# Patient Record
Sex: Female | Born: 1995 | Hispanic: Yes | Marital: Single
Health system: Southern US, Community
[De-identification: ages and names within clinical notes are randomized; demographics above are authoritative.]

---

## 2020-10-02 ENCOUNTER — Other Ambulatory Visit: Payer: Self-pay

## 2020-10-02 ENCOUNTER — Emergency Department (HOSPITAL_COMMUNITY): Payer: Medicaid Other

## 2020-10-02 ENCOUNTER — Emergency Department (HOSPITAL_COMMUNITY)
Admission: EM | Admit: 2020-10-02 | Discharge: 2020-10-02 | Disposition: A | Discharge status: Home / Self Care | Payer: Medicaid Other | Attending: Emergency Medicine | Admitting: Emergency Medicine

## 2020-10-02 ENCOUNTER — Encounter (HOSPITAL_COMMUNITY): Payer: Self-pay | Admitting: Emergency Medicine

## 2020-10-02 DIAGNOSIS — U071 COVID-19: Secondary | ICD-10-CM | POA: Diagnosis not present

## 2020-10-02 DIAGNOSIS — S42351A Displaced comminuted fracture of shaft of humerus, right arm, initial encounter for closed fracture: Secondary | ICD-10-CM | POA: Insufficient documentation

## 2020-10-02 DIAGNOSIS — S4991XA Unspecified injury of right shoulder and upper arm, initial encounter: Secondary | ICD-10-CM | POA: Diagnosis present

## 2020-10-02 DIAGNOSIS — Y92410 Unspecified street and highway as the place of occurrence of the external cause: Secondary | ICD-10-CM | POA: Diagnosis not present

## 2020-10-02 DIAGNOSIS — Y9389 Activity, other specified: Secondary | ICD-10-CM | POA: Insufficient documentation

## 2020-10-02 LAB — CBC WITH DIFFERENTIAL/PLATELET
Abs Immature Granulocytes: 0.06 10*3/uL (ref 0.00–0.07)
Basophils Absolute: 0 10*3/uL (ref 0.0–0.1)
Basophils Relative: 0 %
Eosinophils Absolute: 0.1 10*3/uL (ref 0.0–0.5)
Eosinophils Relative: 1 %
HCT: 39 % (ref 36.0–46.0)
Hemoglobin: 12.7 g/dL (ref 12.0–15.0)
Immature Granulocytes: 1 %
Lymphocytes Relative: 21 %
Lymphs Abs: 2 10*3/uL (ref 0.7–4.0)
MCH: 28.2 pg (ref 26.0–34.0)
MCHC: 32.6 g/dL (ref 30.0–36.0)
MCV: 86.5 fL (ref 80.0–100.0)
Monocytes Absolute: 0.6 10*3/uL (ref 0.1–1.0)
Monocytes Relative: 6 %
Neutro Abs: 6.8 10*3/uL (ref 1.7–7.7)
Neutrophils Relative %: 71 %
Platelets: 240 10*3/uL (ref 150–400)
RBC: 4.51 MIL/uL (ref 3.87–5.11)
RDW: 13.9 % (ref 11.5–15.5)
WBC: 9.6 10*3/uL (ref 4.0–10.5)
nRBC: 0 % (ref 0.0–0.2)

## 2020-10-02 LAB — COMPREHENSIVE METABOLIC PANEL
ALT: 36 U/L (ref 0–44)
AST: 20 U/L (ref 15–41)
Albumin: 3.5 g/dL (ref 3.5–5.0)
Alkaline Phosphatase: 62 U/L (ref 38–126)
Anion gap: 7 (ref 5–15)
BUN: 11 mg/dL (ref 6–20)
CO2: 25 mmol/L (ref 22–32)
Calcium: 8.9 mg/dL (ref 8.9–10.3)
Chloride: 107 mmol/L (ref 98–111)
Creatinine, Ser: 0.77 mg/dL (ref 0.44–1.00)
GFR, Estimated: >60 mL/min (ref >60)
Glucose, Bld: 104 mg/dL — ABNORMAL HIGH (ref 70–99)
Potassium: 4 mmol/L (ref 3.5–5.1)
Sodium: 139 mmol/L (ref 135–145)
Total Bilirubin: 0.3 mg/dL (ref 0.3–1.2)
Total Protein: 6.4 g/dL — ABNORMAL LOW (ref 6.5–8.1)

## 2020-10-02 LAB — RESP PANEL BY RT-PCR (FLU A&B, COVID) ARPGX2
Influenza A by PCR: NEGATIVE
Influenza B by PCR: NEGATIVE
SARS Coronavirus 2 by RT PCR: POSITIVE — AB

## 2020-10-02 LAB — I-STAT BETA HCG BLOOD, ED (MC, WL, AP ONLY): I-stat hCG, quantitative: <5 m[IU]/mL (ref <5)

## 2020-10-02 MED ORDER — HYDROMORPHONE HCL 1 MG/ML IJ SOLN
1.0000 mg | Freq: Once | INTRAMUSCULAR | Status: AC
  Administered 2020-10-02: 1 mg via INTRAVENOUS
  Filled 2020-10-02: qty 1

## 2020-10-02 MED ORDER — HYDROMORPHONE HCL 1 MG/ML IJ SOLN
1.0000 mg | Freq: Once | INTRAMUSCULAR | Status: AC
Start: 2020-10-02 — End: 2020-10-02
  Administered 2020-10-02: 1 mg via INTRAVENOUS
  Filled 2020-10-02: qty 1

## 2020-10-02 MED ORDER — OXYCODONE-ACETAMINOPHEN 5-325 MG PO TABS: 1.0000 | ORAL_TABLET | ORAL | 0 refills | Status: DC | PRN

## 2020-10-02 NOTE — ED Notes (Signed)
Paged ortho 
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
## 2020-10-02 NOTE — Progress Notes (Signed)
Orthopedic Tech Progress Note Patient Details:  Latoya Watson 06/27/1995 [id number]  Ortho Devices Type of Ortho Device: Coapt, Shoulder immobilizer Ortho Device/Splint Location: RUE Ortho Device/Splint Interventions: Ordered, Application, Adjustment   Post Interventions Patient Tolerated: Fair Instructions Provided: Care of device, Poper ambulation with device, Adjustment of device  [name] 10/02/2020, 9:18 PM
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
## 2020-10-02 NOTE — ED Notes (Signed)
Patient transported to X-ray 

## 2020-10-03 ENCOUNTER — Ambulatory Visit (HOSPITAL_COMMUNITY): Payer: Medicaid Other | Admitting: Critical Care Medicine

## 2020-10-03 ENCOUNTER — Encounter (HOSPITAL_COMMUNITY): Payer: Self-pay | Admitting: Orthopaedic Surgery

## 2020-10-03 ENCOUNTER — Encounter (HOSPITAL_BASED_OUTPATIENT_CLINIC_OR_DEPARTMENT_OTHER): Payer: Self-pay

## 2020-10-03 ENCOUNTER — Ambulatory Visit (HOSPITAL_COMMUNITY): Payer: Medicaid Other

## 2020-10-03 ENCOUNTER — Encounter (HOSPITAL_COMMUNITY)
Admission: RE | Disposition: A | Discharge status: Home / Self Care | Payer: Self-pay | Source: Ambulatory Visit | Attending: Orthopaedic Surgery

## 2020-10-03 ENCOUNTER — Ambulatory Visit (HOSPITAL_COMMUNITY)
Admission: RE | Admit: 2020-10-03 | Discharge: 2020-10-03 | Disposition: A | Discharge status: Home / Self Care | Payer: Medicaid Other | Source: Ambulatory Visit | Attending: Orthopaedic Surgery | Admitting: Orthopaedic Surgery

## 2020-10-03 DIAGNOSIS — S42351A Displaced comminuted fracture of shaft of humerus, right arm, initial encounter for closed fracture: Secondary | ICD-10-CM | POA: Insufficient documentation

## 2020-10-03 DIAGNOSIS — Z419 Encounter for procedure for purposes other than remedying health state, unspecified: Secondary | ICD-10-CM

## 2020-10-03 DIAGNOSIS — S42421A Displaced comminuted supracondylar fracture without intercondylar fracture of right humerus, initial encounter for closed fracture: Secondary | ICD-10-CM | POA: Diagnosis not present

## 2020-10-03 DIAGNOSIS — Z8616 Personal history of COVID-19: Secondary | ICD-10-CM | POA: Insufficient documentation

## 2020-10-03 DIAGNOSIS — Z09 Encounter for follow-up examination after completed treatment for conditions other than malignant neoplasm: Secondary | ICD-10-CM

## 2020-10-03 HISTORY — PX: HUMERUS IM NAIL: SHX1769

## 2020-10-03 LAB — CBC
HCT: 36.9 % (ref 36.0–46.0)
Hemoglobin: 12.2 g/dL (ref 12.0–15.0)
MCH: 28.6 pg (ref 26.0–34.0)
MCHC: 33.1 g/dL (ref 30.0–36.0)
MCV: 86.6 fL (ref 80.0–100.0)
Platelets: 236 10*3/uL (ref 150–400)
RBC: 4.26 MIL/uL (ref 3.87–5.11)
RDW: 14.1 % (ref 11.5–15.5)
WBC: 8.7 10*3/uL (ref 4.0–10.5)
nRBC: 0 % (ref 0.0–0.2)

## 2020-10-03 LAB — POCT PREGNANCY, URINE: Preg Test, Ur: NEGATIVE

## 2020-10-03 SURGERY — INSERTION, INTRAMEDULLARY ROD, HUMERUS
Anesthesia: Monitor Anesthesia Care | Site: Shoulder | Laterality: Right

## 2020-10-03 SURGERY — OPEN REDUCTION INTERNAL FIXATION (ORIF) HUMERAL SHAFT FRACTURE
Anesthesia: General | Laterality: Right

## 2020-10-03 MED ORDER — DIPHENHYDRAMINE HCL 50 MG/ML IJ SOLN
INTRAMUSCULAR | Status: DC | PRN
  Administered 2020-10-03: 6.25 mg via INTRAVENOUS

## 2020-10-03 MED ORDER — VANCOMYCIN HCL 1000 MG IV SOLR
INTRAVENOUS | Status: AC
  Filled 2020-10-03: qty 1000

## 2020-10-03 MED ORDER — GLYCOPYRROLATE 0.2 MG/ML IJ SOLN
INTRAMUSCULAR | Status: DC | PRN
  Administered 2020-10-03: .2 mg via INTRAVENOUS

## 2020-10-03 MED ORDER — CHLORHEXIDINE GLUCONATE 0.12 % MT SOLN
15.0000 mL | OROMUCOSAL | Status: AC
  Filled 2020-10-03: qty 15

## 2020-10-03 MED ORDER — ROCURONIUM BROMIDE 10 MG/ML (PF) SYRINGE
PREFILLED_SYRINGE | INTRAVENOUS | Status: DC | PRN
  Administered 2020-10-03: 100 mg via INTRAVENOUS

## 2020-10-03 MED ORDER — ARTIFICIAL TEARS OPHTHALMIC OINT
TOPICAL_OINTMENT | OPHTHALMIC | Status: DC | PRN
Start: 2020-10-03 — End: 2020-10-03
  Administered 2020-10-03: 1 via OPHTHALMIC

## 2020-10-03 MED ORDER — ACETAMINOPHEN 500 MG PO TABS: 1000.0000 mg | ORAL_TABLET | Freq: Three times a day (TID) | ORAL | 0 refills | Status: AC

## 2020-10-03 MED ORDER — FENTANYL CITRATE (PF) 100 MCG/2ML IJ SOLN
INTRAMUSCULAR | Status: AC
  Administered 2020-10-03: 100 ug via INTRAVENOUS
  Filled 2020-10-03: qty 2

## 2020-10-03 MED ORDER — PHENYLEPHRINE 40 MCG/ML (10ML) SYRINGE FOR IV PUSH (FOR BLOOD PRESSURE SUPPORT)
PREFILLED_SYRINGE | INTRAVENOUS | Status: DC | PRN
  Administered 2020-10-03 (×2): 80 ug via INTRAVENOUS

## 2020-10-03 MED ORDER — CHLORHEXIDINE GLUCONATE 0.12 % MT SOLN
OROMUCOSAL | Status: AC
  Administered 2020-10-03: 15 mL via OROMUCOSAL
  Filled 2020-10-03: qty 15

## 2020-10-03 MED ORDER — LIDOCAINE 2% (20 MG/ML) 5 ML SYRINGE
INTRAMUSCULAR | Status: AC
  Filled 2020-10-03: qty 5

## 2020-10-03 MED ORDER — ONDANSETRON HCL 4 MG/2ML IJ SOLN
INTRAMUSCULAR | Status: DC | PRN
  Administered 2020-10-03: 4 mg via INTRAVENOUS

## 2020-10-03 MED ORDER — LIDOCAINE 2% (20 MG/ML) 5 ML SYRINGE
INTRAMUSCULAR | Status: DC | PRN
  Administered 2020-10-03: 40 mg via INTRAVENOUS

## 2020-10-03 MED ORDER — CEFAZOLIN SODIUM-DEXTROSE 2-3 GM-%(50ML) IV SOLR
INTRAVENOUS | Status: DC | PRN
  Administered 2020-10-03: 2 g via INTRAVENOUS

## 2020-10-03 MED ORDER — MIDAZOLAM HCL 5 MG/5ML IJ SOLN
INTRAMUSCULAR | Status: DC | PRN
  Administered 2020-10-03: 2 mg via INTRAVENOUS

## 2020-10-03 MED ORDER — GABAPENTIN 100 MG PO CAPS: 100.0000 mg | ORAL_CAPSULE | Freq: Three times a day (TID) | ORAL | 0 refills | Status: AC

## 2020-10-03 MED ORDER — GLYCOPYRROLATE PF 0.2 MG/ML IJ SOSY
PREFILLED_SYRINGE | INTRAMUSCULAR | Status: AC
  Filled 2020-10-03: qty 1

## 2020-10-03 MED ORDER — ONDANSETRON HCL 4 MG PO TABS: 4.0000 mg | ORAL_TABLET | Freq: Three times a day (TID) | ORAL | 0 refills | Status: AC | PRN

## 2020-10-03 MED ORDER — LACTATED RINGERS IV SOLN: INTRAVENOUS | Status: DC

## 2020-10-03 MED ORDER — MIDAZOLAM HCL 2 MG/2ML IJ SOLN: 2.0000 mg | Freq: Once | INTRAMUSCULAR | Status: AC

## 2020-10-03 MED ORDER — PROPOFOL 10 MG/ML IV BOLUS
INTRAVENOUS | Status: AC
  Filled 2020-10-03: qty 20

## 2020-10-03 MED ORDER — CEFAZOLIN SODIUM-DEXTROSE 2-4 GM/100ML-% IV SOLN
INTRAVENOUS | Status: AC
  Filled 2020-10-03: qty 100

## 2020-10-03 MED ORDER — PHENYLEPHRINE HCL-NACL 10-0.9 MG/250ML-% IV SOLN
INTRAVENOUS | Status: DC | PRN
  Administered 2020-10-03: 20 ug/min via INTRAVENOUS

## 2020-10-03 MED ORDER — DEXAMETHASONE SODIUM PHOSPHATE 10 MG/ML IJ SOLN
INTRAMUSCULAR | Status: DC | PRN
  Administered 2020-10-03: 10 mg via INTRAVENOUS

## 2020-10-03 MED ORDER — PROPOFOL 10 MG/ML IV BOLUS
INTRAVENOUS | Status: DC | PRN
  Administered 2020-10-03: 150 mg via INTRAVENOUS
  Administered 2020-10-03: 50 mg via INTRAVENOUS

## 2020-10-03 MED ORDER — VANCOMYCIN HCL 1 G IV SOLR
INTRAVENOUS | Status: DC | PRN
  Administered 2020-10-03: 1000 mg

## 2020-10-03 MED ORDER — FENTANYL CITRATE (PF) 100 MCG/2ML IJ SOLN: 25.0000 ug | INTRAMUSCULAR | Status: DC | PRN

## 2020-10-03 MED ORDER — ALBUTEROL SULFATE HFA 108 (90 BASE) MCG/ACT IN AERS
INHALATION_SPRAY | RESPIRATORY_TRACT | Status: DC | PRN
  Administered 2020-10-03: 4 via RESPIRATORY_TRACT

## 2020-10-03 MED ORDER — 0.9 % SODIUM CHLORIDE (POUR BTL) OPTIME
TOPICAL | Status: DC | PRN
  Administered 2020-10-03: 1000 mL

## 2020-10-03 MED ORDER — FENTANYL CITRATE (PF) 100 MCG/2ML IJ SOLN: 100.0000 ug | Freq: Once | INTRAMUSCULAR | Status: AC

## 2020-10-03 MED ORDER — SUGAMMADEX SODIUM 200 MG/2ML IV SOLN
INTRAVENOUS | Status: DC | PRN
  Administered 2020-10-03: 200 mg via INTRAVENOUS

## 2020-10-03 MED ORDER — DEXAMETHASONE SODIUM PHOSPHATE 10 MG/ML IJ SOLN
INTRAMUSCULAR | Status: DC | PRN
  Administered 2020-10-03: 5 mg

## 2020-10-03 MED ORDER — FENTANYL CITRATE (PF) 250 MCG/5ML IJ SOLN
INTRAMUSCULAR | Status: AC
  Filled 2020-10-03: qty 5

## 2020-10-03 MED ORDER — MELOXICAM 15 MG PO TABS: 15.0000 mg | ORAL_TABLET | Freq: Every day | ORAL | 0 refills | Status: AC

## 2020-10-03 MED ORDER — OXYCODONE HCL 5 MG PO TABS: ORAL_TABLET | ORAL | 0 refills | Status: AC

## 2020-10-03 MED ORDER — CEFAZOLIN SODIUM-DEXTROSE 2-4 GM/100ML-% IV SOLN: 2.0000 g | INTRAVENOUS | Status: DC

## 2020-10-03 MED ORDER — ROPIVACAINE HCL 5 MG/ML IJ SOLN
INTRAMUSCULAR | Status: DC | PRN
  Administered 2020-10-03: 25 mL via PERINEURAL

## 2020-10-03 MED ORDER — MIDAZOLAM HCL 2 MG/2ML IJ SOLN
INTRAMUSCULAR | Status: AC
  Administered 2020-10-03: 2 mg via INTRAVENOUS
  Filled 2020-10-03: qty 2

## 2020-10-03 MED ORDER — LACTATED RINGERS IV SOLN: INTRAVENOUS | Status: DC | PRN

## 2020-10-03 MED ORDER — ALBUMIN HUMAN 5 % IV SOLN
INTRAVENOUS | Status: DC | PRN
Start: 2020-10-03 — End: 2020-10-03

## 2020-10-03 MED ORDER — MIDAZOLAM HCL 2 MG/2ML IJ SOLN
INTRAMUSCULAR | Status: AC
  Filled 2020-10-03: qty 2

## 2020-10-03 MED ORDER — FENTANYL CITRATE (PF) 100 MCG/2ML IJ SOLN
INTRAMUSCULAR | Status: DC | PRN
  Administered 2020-10-03 (×5): 50 ug via INTRAVENOUS

## 2020-10-03 SURGICAL SUPPLY — 61 items
BAG COUNTER SPONGE SURGICOUNT (BAG) IMPLANT
BIT DRILL 10 HOLLOW (BIT) ×2 IMPLANT
BIT DRILL 3.8X270 (BIT) ×2 IMPLANT
BIT DRILL SHORT 3.2MM (DRILL) ×1 IMPLANT
CLSR STERI-STRIP ANTIMIC 1/2X4 (GAUZE/BANDAGES/DRESSINGS) ×4 IMPLANT
COVER READER SURGICOUNT DISP (BAG) ×4 IMPLANT
COVER SURGICAL LIGHT HANDLE (MISCELLANEOUS) ×2 IMPLANT
DRAPE C-ARM 42X120 X-RAY (DRAPES) ×2 IMPLANT
DRAPE C-ARM 42X72 X-RAY (DRAPES) ×2 IMPLANT
DRAPE HALF SHEET 40X57 (DRAPES) ×2 IMPLANT
DRAPE INCISE IOBAN 66X45 STRL (DRAPES) IMPLANT
DRAPE ORTHO SPLIT 77X108 STRL (DRAPES) ×2
DRAPE SHEET LG 3/4 BI-LAMINATE (DRAPES) ×4 IMPLANT
DRAPE SURG ORHT 6 SPLT 77X108 (DRAPES) ×2 IMPLANT
DRAPE U-SHAPE 47X51 STRL (DRAPES) ×2 IMPLANT
DRILL SHORT 3.2MM (DRILL) ×2
DRSG ADAPTIC 3X8 NADH LF (GAUZE/BANDAGES/DRESSINGS) IMPLANT
DRSG AQUACEL AG ADV 3.5X 6 (GAUZE/BANDAGES/DRESSINGS) IMPLANT
DRSG AQUACEL AG ADV 3.5X10 (GAUZE/BANDAGES/DRESSINGS) IMPLANT
DRSG PAD ABDOMINAL 8X10 ST (GAUZE/BANDAGES/DRESSINGS) ×4 IMPLANT
DURAPREP 26ML APPLICATOR (WOUND CARE) ×2 IMPLANT
GAUZE SPONGE 4X4 12PLY STRL (GAUZE/BANDAGES/DRESSINGS) ×2 IMPLANT
GAUZE SPONGE 4X4 12PLY STRL LF (GAUZE/BANDAGES/DRESSINGS) ×2 IMPLANT
GAUZE XEROFORM 1X8 LF (GAUZE/BANDAGES/DRESSINGS) IMPLANT
GLOVE SRG 8 PF TXTR STRL LF DI (GLOVE) ×1 IMPLANT
GLOVE SURG ENC MOIS LTX SZ6.5 (GLOVE) ×2 IMPLANT
GLOVE SURG LTX SZ8 (GLOVE) ×4 IMPLANT
GLOVE SURG UNDER POLY LF SZ6.5 (GLOVE) ×2 IMPLANT
GLOVE SURG UNDER POLY LF SZ8 (GLOVE) ×1
GOWN STRL REUS W/TWL LRG LVL3 (GOWN DISPOSABLE) ×4 IMPLANT
GUIDEROD SS 2.5MMX280MM (ROD) ×2 IMPLANT
GUIDEWIRE THREADED 2.8 (WIRE) ×2 IMPLANT
KIT BASIN OR (CUSTOM PROCEDURE TRAY) ×2 IMPLANT
KIT STABILIZATION SHOULDER (MISCELLANEOUS) ×2 IMPLANT
KIT TURNOVER KIT A (KITS) IMPLANT
NAIL CANN HUM IM 7X255 (Nail) ×2 IMPLANT
NS IRRIG 1000ML POUR BTL (IV SOLUTION) ×2 IMPLANT
PACK SHOULDER (CUSTOM PROCEDURE TRAY) ×2 IMPLANT
PAD ABD 8X10 STRL (GAUZE/BANDAGES/DRESSINGS) ×2 IMPLANT
PROTECTOR NERVE ULNAR (MISCELLANEOUS) ×2 IMPLANT
RESTRAINT HEAD UNIVERSAL NS (MISCELLANEOUS) ×2 IMPLANT
ROD REAMING 2.5 (INSTRUMENTS) ×2 IMPLANT
SCREW LOCK 4.5X36 TI FT (Screw) ×2 IMPLANT
SCREW LOCKING 4.0X28MM (Screw) ×2 IMPLANT
STRIP CLOSURE SKIN 1/2X4 (GAUZE/BANDAGES/DRESSINGS) ×2 IMPLANT
SUT ETHIBOND NAB CT1 #1 30IN (SUTURE) ×2 IMPLANT
SUT FIBERWIRE #2 38 T-5 BLUE (SUTURE) ×2
SUT FIBERWIRE 2-0 18 17.9 3/8 (SUTURE) ×4
SUT MNCRL AB 4-0 PS2 18 (SUTURE) ×4 IMPLANT
SUT VIC AB 0 CT1 36 (SUTURE) ×2 IMPLANT
SUT VIC AB 1 CT1 36 (SUTURE) ×2 IMPLANT
SUT VIC AB 2-0 CT1 27 (SUTURE) ×1
SUT VIC AB 2-0 CT1 TAPERPNT 27 (SUTURE) ×1 IMPLANT
SUT VIC AB 3-0 CT1 27 (SUTURE) ×1
SUT VIC AB 3-0 CT1 TAPERPNT 27 (SUTURE) ×1 IMPLANT
SUT VIC AB 3-0 SH 27 (SUTURE) ×1
SUT VIC AB 3-0 SH 27X BRD (SUTURE) ×1 IMPLANT
SUTURE FIBERWR #2 38 T-5 BLUE (SUTURE) ×1 IMPLANT
SUTURE FIBERWR 2-0 18 17.9 3/8 (SUTURE) ×2 IMPLANT
TAPE CLOTH SURG 4X10 WHT LF (GAUZE/BANDAGES/DRESSINGS) ×2 IMPLANT
WATER STERILE IRR 1000ML POUR (IV SOLUTION) ×2 IMPLANT

## 2020-10-03 NOTE — Discharge Instructions (Addendum)
Ramond Marrow MD, MPH Alfonse Alpers, PA-C [facility] [address], [address] [phone] (tel)   [phone] (fax)   POST-OPERATIVE INSTRUCTIONS - UPPER EXTREMITY   WOUND CARE You may remove the Operative Dressing on Post-Op Day #3 (72hrs after surgery).   Alternatively if you would like you can leave dressing on until follow-up if within 7-8 days but keep it dry. Leave steri-strips in place until they fall off on their own, usually 2 weeks postop. There may be a small amount of fluid/bleeding leaking at the surgical site.  This is normal; the shoulder is filled with fluid during the procedure and can leak for 24-48hrs after surgery.  You may change/reinforce the bandage as needed.  Use the Cryocuff or Ice as often as possible for the first 7 days, then as needed for pain relief. Always keep a towel, ACE wrap or other barrier between the cooling unit and your skin.  You may shower on Post-Op Day #3. Gently pat the area dry. Do not soak the shoulder in water or submerge it. Keep dry incisions as dry as possible. Do not go swimming in the pool or ocean until 4 weeks after surgery or when otherwise instructed.    EXERCISES Wear the sling at all times (including when you sleep!)  You may remove the sling for showering, but keep the arm across the chest or in a secondary sling.    Do not lift anything heavier than a cell phone with your operative arm!  REGIONAL ANESTHESIA (NERVE BLOCKS) The anesthesia team may have performed a nerve block for you if safe in the setting of your care.  This is a great tool used to minimize pain.  Typically the block may start wearing off overnight but the long acting medicine may last for 3-4 days.  The nerve block wearing off can be a challenging period but please utilize your as needed pain medications to try and manage this period.    POST-OP MEDICATIONS- Multimodal approach to pain control In general your pain will be controlled  with a combination of substances.  Prescriptions unless otherwise discussed are electronically sent to your pharmacy.  This is a carefully made plan we use to minimize narcotic use.     Meloxicam - Anti-inflammatory medication taken on a scheduled basis Acetaminophen - Non-narcotic pain medicine taken on a scheduled basis  Gabapentin - this is a medication to help with pain, take on a scheduled basis Oxycodone - This is a strong narcotic, to be used only on an "as needed" basis for SEVERE pain. If you already picked up pain medication from the pharmacy that was sent in from the Emergency Department, it will be too soon to fill a new prescription Zofran -  take as needed for nausea   FOLLOW-UP If you develop a Fever (>101.5), Redness or Drainage from the surgical incision site, please call our office to arrange for an evaluation. Please call the office to schedule a follow-up appointment for a wound check, 7-10 days post-operatively.  IF YOU HAVE ANY QUESTIONS, PLEASE FEEL FREE TO CALL OUR OFFICE.  HELPFUL INFORMATION  If you had a block, it will wear off between 8-24 hrs postop typically.  This is period when your pain may go from nearly zero to the pain you would have had post-op without the block.  This is an abrupt transition but nothing dangerous is happening.  You may take an extra dose of narcotic when this happens.  Your arm  will be in a sling following surgery. You will be in this sling for the next 6 weeks.  I will let you know the exact duration at your follow-up visit.  You may be more comfortable sleeping in a semi-seated position the first few nights following surgery.  Keep a pillow propped under the elbow and forearm for comfort.  If you have a recliner type of chair it might be beneficial.  If not that is fine too, but it would be helpful to sleep propped up with pillows behind your operated shoulder as well under your elbow and forearm.  This will reduce pulling on the suture  lines.  When dressing, put your operative arm in the sleeve first.  When getting undressed, take your operative arm out last.  Loose fitting, button-down shirts are recommended.  In most states it is against the law to drive while your arm is in a sling. And certainly against the law to drive while taking narcotics.  You may return to work/school in the next couple of days when you feel up to it. Desk work and typing in the sling is fine.  We suggest you use the pain medication the first night prior to going to bed, in order to ease any pain when the anesthesia wears off. You should avoid taking pain medications on an empty stomach as it will make you nauseous.  Do not drink alcoholic beverages or take illicit drugs when taking pain medications.  Pain medication may make you constipated.  Below are a few solutions to try in this order: Decrease the amount of pain medication if you aren't having pain. Drink lots of decaffeinated fluids. Drink prune juice and/or each dried prunes  If the first 3 don't work start with additional solutions Take Colace - an over-the-counter stool softener Take Senokot - an over-the-counter laxative Take Miralax - a stronger over-the-counter laxative    For more information including helpful videos and documents visit our website:   [email or web address]

## 2020-10-03 NOTE — Progress Notes (Signed)
Upon arrival to Short Stay room, patient still had contacts present in both eyes. Patient removed left contact from eye without complication, patient was unable to obtain contact from right eye after multiple attempts. This CRNA obtained the contact without complication and placed in container in belongings bag. Noted eye redness, Woodrum MD informed, artificial tear ointment applied post intubation to right eye.  Also, patient had a gold ring present on operative hand. Removed by this CRNA post induction of anesthesia. Witnessed by Everardo Pacific MD and Armond Hang MD. Will give to PACU RN.

## 2020-10-03 NOTE — Anesthesia Postprocedure Evaluation (Signed)
Anesthesia Post Note  Patient: Latoya Watson  Procedure(s) Performed: INTRAMEDULLARY (IM) NAIL HUMERAL (Right: Shoulder)     Patient location during evaluation: PACU Anesthesia Type: Regional and MAC Level of consciousness: awake and alert Pain management: pain level controlled Vital Signs Assessment: post-procedure vital signs reviewed and stable Respiratory status: spontaneous breathing and respiratory function stable Cardiovascular status: stable Postop Assessment: no apparent nausea or vomiting Anesthetic complications: no   No notable events documented.  Last Vitals:  Vitals:   10/03/20 1750 10/03/20 1805  BP: (!) 109/59 127/76  Pulse: (!) 110 (!) 104  Resp: (!) 26 (!) 25  Temp: (!) 36.1 C   SpO2: 100% 100%    Last Pain:  Vitals:   10/03/20 1805  TempSrc:   PainSc: Asleep                 Danine Hor DANIEL
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
## 2020-10-03 NOTE — Interval H&P Note (Signed)
I again discussed risk and benefits with the patient.  True open reduction internal fixation would leave the patient with a significant scar and have a high risk of devitalizing the fracture fragments.  We talked about open reduction internal fixation versus nailing.  We think the indirect reduction associated with nailing may preserve the patient's soft tissues and she was adamant that she wanted to try and avoid a scar if possible.  She understands the risk of nonunion is present no matter what we do as well as neurovascular damage.  All questions were answered.   History and Physical Interval Note:  10/03/2020 12:43 PM  Latoya Watson  has presented today for surgery, with the diagnosis of right humerus fracture.  The various methods of treatment have been discussed with the patient and family. After consideration of risks, benefits and other options for treatment, the patient has consented to  Procedure(s): INTRAMEDULLARY (IM) NAIL HUMERAL (Right) as a surgical intervention.  The patient's history has been reviewed, patient examined, no change in status, stable for surgery.  I have reviewed the patient's chart and labs.  Questions were answered to the patient's satisfaction.     Bjorn Pippin

## 2020-10-03 NOTE — Anesthesia Procedure Notes (Signed)
Procedure Name: Intubation Date/Time: 10/03/2020 3:25 PM Performed by: Zollie Scale, CRNA Pre-anesthesia Checklist: Patient identified, Emergency Drugs available, Suction available and Patient being monitored Patient Re-evaluated:Patient Re-evaluated prior to induction Oxygen Delivery Method: Circle System Utilized Preoxygenation: Pre-oxygenation with 100% oxygen Induction Type: IV induction Ventilation: Mask ventilation without difficulty and Oral airway inserted - appropriate to patient size Laryngoscope Size: Hyacinth Meeker and 2 Grade View: Grade I Tube type: Oral Tube size: 7.0 mm Number of attempts: 1 Airway Equipment and Method: Stylet and Oral airway Placement Confirmation: ETT inserted through vocal cords under direct vision, positive ETCO2 and breath sounds checked- equal and bilateral Secured at: 21 cm Tube secured with: Tape Dental Injury: Teeth and Oropharynx as per pre-operative assessment

## 2020-10-03 NOTE — Transfer of Care (Signed)
Immediate Anesthesia Transfer of Care Note  Patient: Latoya Watson  Procedure(s) Performed: INTRAMEDULLARY (IM) NAIL HUMERAL (Right: Shoulder)  Patient Location: PACU  Anesthesia Type:GA combined with regional for post-op pain  Level of Consciousness: drowsy and patient cooperative  Airway & Oxygen Therapy: Patient Spontanous Breathing  Post-op Assessment: Report given to RN, Post -op Vital signs reviewed and stable and Patient moving all extremities X 4  Post vital signs: Reviewed and stable  Last Vitals:  Vitals Value Taken Time  BP 109/59 10/03/20 1750  Temp 36.1 C 10/03/20 1750  Pulse 107 10/03/20 1755  Resp 24 10/03/20 1755  SpO2 98 % 10/03/20 1755  Vitals shown include unvalidated device data.  Last Pain:  Vitals:   10/03/20 1750  TempSrc:   PainSc: 0-No pain         Complications: No notable events documented.

## 2020-10-03 NOTE — Anesthesia Procedure Notes (Signed)
Anesthesia Regional Block: Supraclavicular block   Pre-Anesthetic Checklist: , timeout performed,  Correct Patient, Correct Site, Correct Laterality,  Correct Procedure, Correct Position, site marked,  Risks and benefits discussed,  Surgical consent,  Pre-op evaluation,  At surgeon's request and post-op pain management  Laterality: Right  Prep: Maximum Sterile Barrier Precautions used, chloraprep       Needles:  Injection technique: Single-shot  Needle Type: Echogenic Stimulator Needle     Needle Length: 9cm  Needle Gauge: 22     Additional Needles:   Procedures:,,,, ultrasound used (permanent image in chart),,    Narrative:  Start time: 10/03/2020 2:05 PM End time: 10/03/2020 2:05 PM Injection made incrementally with aspirations every 5 mL.  Performed by: Personally  Anesthesiologist: Elmer Picker, MD  Additional Notes: Monitors applied. No increased pain on injection. No increased resistance to injection. Injection made in 5cc increments. Good needle visualization. Patient tolerated procedure well.

## 2020-10-04 ENCOUNTER — Ambulatory Visit (HOSPITAL_BASED_OUTPATIENT_CLINIC_OR_DEPARTMENT_OTHER): Admit: 2020-10-04 | Payer: No Typology Code available for payment source | Admitting: Orthopaedic Surgery

## 2020-10-04 ENCOUNTER — Encounter (HOSPITAL_COMMUNITY): Payer: Self-pay | Admitting: Orthopaedic Surgery
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
## 2020-11-15 ENCOUNTER — Encounter: Payer: Self-pay | Admitting: Physical Therapy

## 2020-11-15 ENCOUNTER — Ambulatory Visit: Payer: Medicaid Other | Attending: Orthopaedic Surgery | Admitting: Physical Therapy

## 2020-11-15 ENCOUNTER — Other Ambulatory Visit: Payer: Self-pay

## 2020-11-15 DIAGNOSIS — M25611 Stiffness of right shoulder, not elsewhere classified: Secondary | ICD-10-CM | POA: Diagnosis present

## 2020-11-15 DIAGNOSIS — M6281 Muscle weakness (generalized): Secondary | ICD-10-CM | POA: Diagnosis present

## 2020-11-15 DIAGNOSIS — M25521 Pain in right elbow: Secondary | ICD-10-CM | POA: Diagnosis present

## 2020-11-15 DIAGNOSIS — G8929 Other chronic pain: Secondary | ICD-10-CM | POA: Diagnosis present

## 2020-11-15 DIAGNOSIS — M25511 Pain in right shoulder: Secondary | ICD-10-CM | POA: Insufficient documentation
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
## 2020-11-22 ENCOUNTER — Ambulatory Visit: Payer: Medicaid Other

## 2020-11-26 ENCOUNTER — Telehealth: Payer: Self-pay

## 2020-11-26 ENCOUNTER — Ambulatory Visit: Payer: Medicaid Other

## 2020-11-26 NOTE — Telephone Encounter (Signed)
Left message regarding pt's first no-show. Stated the pt's next appointment time and date and left the clinic's phone number in case the pt needs to cancel or reschedule future appointments.

## 2020-11-30 ENCOUNTER — Ambulatory Visit: Payer: Medicaid Other

## 2020-11-30 ENCOUNTER — Telehealth: Payer: Self-pay

## 2020-11-30 NOTE — Telephone Encounter (Signed)
Left message for pt regarding her 2nd no-show. Instructed that she will only be able to schedule 1 visit at a time moving forward and that her future appointments besides her upcoming appointment on Monday have been cancelled. She was also informed that a 3rd no-show would be grounds for discharge. Provided the clinic phone number and instructed to call if she needs to cancel appointments in the future.

## 2020-12-03 ENCOUNTER — Other Ambulatory Visit: Payer: Self-pay

## 2020-12-03 ENCOUNTER — Ambulatory Visit: Payer: Medicaid Other

## 2020-12-03 DIAGNOSIS — M25511 Pain in right shoulder: Secondary | ICD-10-CM | POA: Diagnosis not present

## 2020-12-03 DIAGNOSIS — M25521 Pain in right elbow: Secondary | ICD-10-CM

## 2020-12-03 DIAGNOSIS — M25611 Stiffness of right shoulder, not elsewhere classified: Secondary | ICD-10-CM

## 2020-12-03 DIAGNOSIS — M6281 Muscle weakness (generalized): Secondary | ICD-10-CM

## 2020-12-03 NOTE — Patient Instructions (Signed)
  [id number]

## 2020-12-03 NOTE — Therapy (Signed)
[place], [place], [address] Phone: [phone]   Fax:  [phone]  Physical Therapy Treatment/ Discharge Summary  Patient Details  Name: Lanayah Gartley MRN: [id number] Date of Birth: 1995/10/19 Referring Provider (PT): Hiram Gash, MD   Encounter Date: 12/03/2020   PT End of Session - 12/03/20 1617     Visit Number 2    Number of Visits 17    Date for PT Re-Evaluation 01/10/21    Authorization Type UHC Managed MCD    PT Start Time 1550    PT Stop Time 1630    PT Time Calculation (min) 40 min    Activity Tolerance Patient tolerated treatment well    Behavior During Therapy [facility] for tasks assessed/performed             History reviewed. No pertinent past medical history.  Past Surgical History:  Procedure Laterality Date   HUMERUS IM NAIL Right 10/03/2020   Procedure: INTRAMEDULLARY (IM) NAIL HUMERAL;  Surgeon: Hiram Gash, MD;  Location: [place];  Service: Orthopedics;  Laterality: Right;    There were no vitals filed for this visit.   Subjective Assessment - 12/03/20 1548     Subjective Pt reports she has been doing well since her eval, adding that she has been adherent to her HEP, doing her stretches regularly. She reports minor pain in her posterior R shoulder, but has been doing much better overall. She adds that she is leaving this week to go to [place] for 3 months. She agrees to be discharged at this time.    Currently in Pain? No/denies    Pain Score 0-No pain                [facility] PT Assessment - 12/03/20 0001       AROM   Right Shoulder Extension 50 Degrees    Right Shoulder Flexion 165 Degrees    Right Shoulder ABduction 185 Degrees    Right Shoulder Internal Rotation 60 Degrees    Right Shoulder External Rotation 75 Degrees      Strength   Right Shoulder Flexion 4/5    Right Shoulder Extension 5/5    Right Shoulder ABduction 5/5    Right Shoulder Internal  Rotation 5/5    Right Shoulder External Rotation 5/5                           [facility] Adult PT Treatment/Exercise - 12/03/20 0001       Shoulder Exercises: Seated   Row Strengthening    Row Weight (lbs) 35    Row Limitations High and low rows 2x10 each    Other Seated Exercises lat pulldowns 2x10 with 35# cable      Shoulder Exercises: Prone   Other Prone Exercises Quadruped alternating shoulder flexion 2x10 BIL      Shoulder Exercises: Standing   External Rotation Strengthening    Theraband Level (Shoulder External Rotation) Level 2 (Red)    External Rotation Limitations 3x10    Flexion Strengthening;Right   3x10 with 5# DB to overhead cabinet   Other Standing Exercises PNF D2 shoulder flexion with YTB 3x8 on R                     PT Education - 12/03/20 1616     Education Details Updated HEP, discussed progress made in PT as indicated by objective measures.  Person(s) Educated Patient    Methods Explanation;Demonstration;Handout    Comprehension Verbalized understanding;Returned demonstration              PT Short Term Goals - 12/03/20 1633       PT SHORT TERM GOAL #1   Title pt to be IND with initial HEP    Baseline no previous HEP    Time 6    Period Weeks    Status Achieved    Target Date 12/06/20      PT SHORT TERM GOAL #2   Title increase R shoulder PROM/ AAROM flexion/ abductiont to >/= 100 degrees therapuetic progression    Baseline see flowsheet    Time 3    Period Weeks    Status Achieved    Target Date 12/06/20               PT Long Term Goals - 12/03/20 1634       PT LONG TERM GOAL #1   Title increase R shoulder AROM flexion/ abduction to >/= 120 degrees, and IR/ER WFL compared bil for functional ROM required for ADLs    Baseline see flowsheet    Time 6    Period Weeks    Status Achieved      PT LONG TERM GOAL #2   Title increase R gross shoulder strength to >/=4/5 to for fucntional strength required  for general ADLs    Time 6    Period Weeks    Status Achieved      PT LONG TERM GOAL #3   Title increase quickdash by >/= 20 points to demo improvement in function    Baseline 56.82%    Time 6    Period Weeks    Status --   Not assessed at D/C     PT LONG TERM GOAL #4   Title pt to be IND with all HEP to be able to maintain and progress current LOF IND following D/C    Baseline no previous HEP    Time 6    Period Weeks    Status Achieved                   Plan - 12/03/20 1623     Clinical Impression Statement Pt responded well to all interventions today, demonstrating proper form and no increase in pain with all selected interventions. Due to the pt leaving this week to travel to [place] for three months, the pt is discharged from PT at this time. Upon re-assessment of her objective measures, the pt has met her functional goals of PT. She shows excellent progress in shoulder AROM, strength, and pain levels. She is discharged from PT at this time.    PT Next Visit Plan Pt is discharged from PT    PT Arlee and Agree with Plan of Care Patient             Patient will benefit from skilled therapeutic intervention in order to improve the following deficits and impairments:     Visit Diagnosis: Chronic right shoulder pain  Pain in right elbow  Muscle weakness (generalized)  Stiffness of right shoulder, not elsewhere classified     Problem List There are no problems to display for this patient.    [place], [place], [address] Phone: [phone]   Fax:  [phone]  Name: Braylee Lal MRN: [id number] Date of Birth: 06-15-1995  PHYSICAL THERAPY DISCHARGE SUMMARY  Visits from Start of Care: 2  Current functional level related to goals / functional outcomes: Pt has met her functional goals of PT   Remaining deficits: Slight impairments in  R shoulder strength   Education / Equipment: HEP, therabands   Patient agrees to discharge. Patient goals were met. Patient is being discharged due to meeting the stated rehab goals.  Vanessa [place], PT, DPT 12/03/20 4:37 PM

## 2020-12-05 ENCOUNTER — Ambulatory Visit: Payer: Medicaid Other

## 2020-12-10 ENCOUNTER — Encounter: Payer: Medicaid Other | Admitting: Physical Therapy

## 2023-06-19 IMAGING — RF DG C-ARM 1-60 MIN
1 series · 11 of 11 positions shown · non-contrast
Comparison: October 02, 2020.

CLINICAL DATA: Intramedullary nail.

EXAM:
DG C-ARM 1-60 MIN; RIGHT HUMERUS - 2+ VIEW
FLUOROSCOPY TIME:  Fluoroscopy Time:  2 minutes and 18 seconds.
Number of Acquired Spot Images: 11.

[Series 1: run · 11 of 11 slices shown]
[im 1/11]
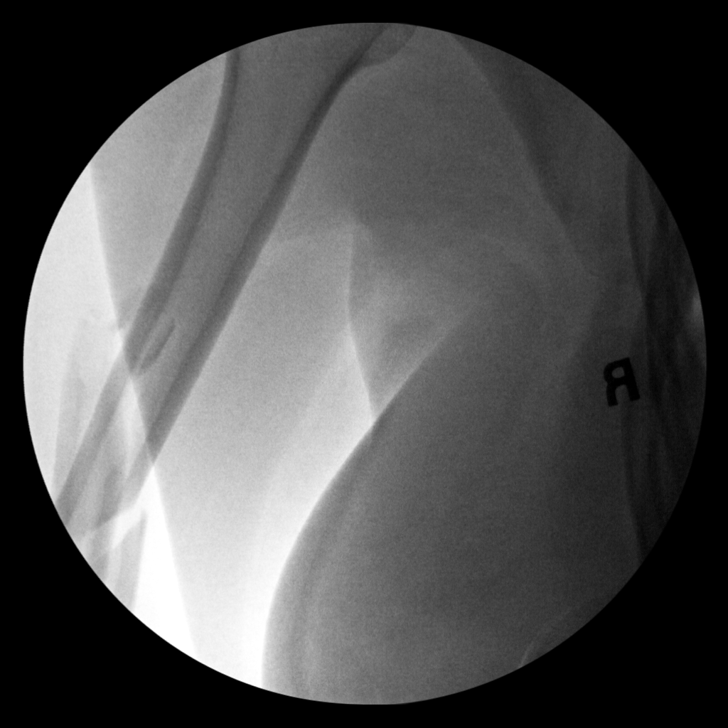
[im 2/11]
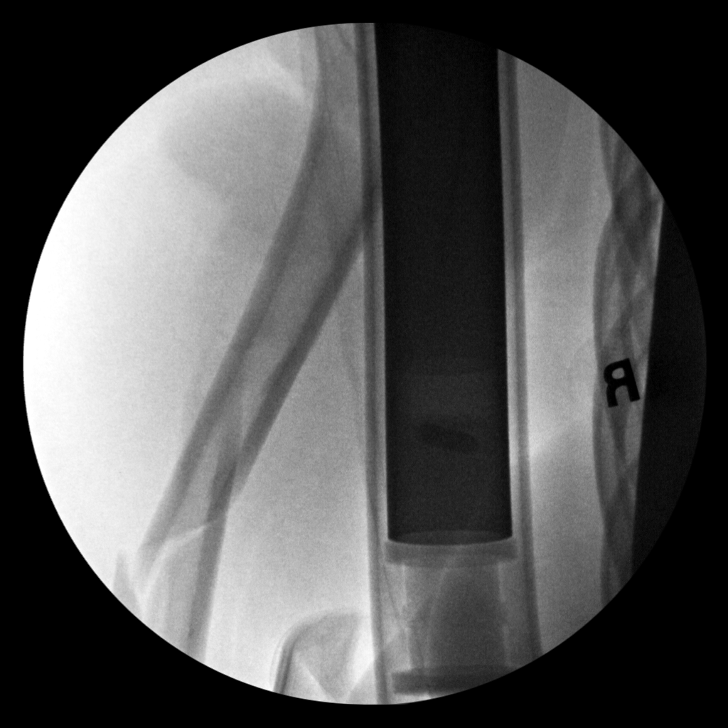
[im 3/11]
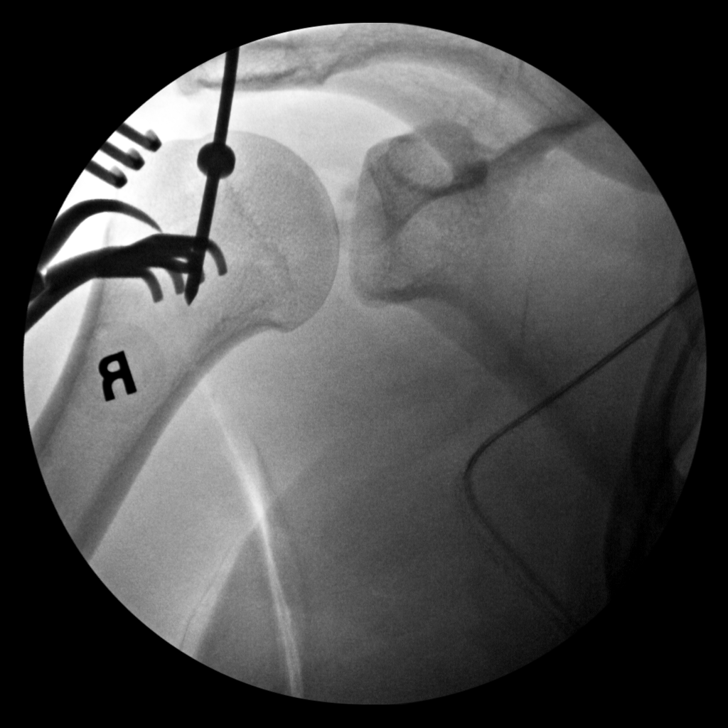
[im 4/11]
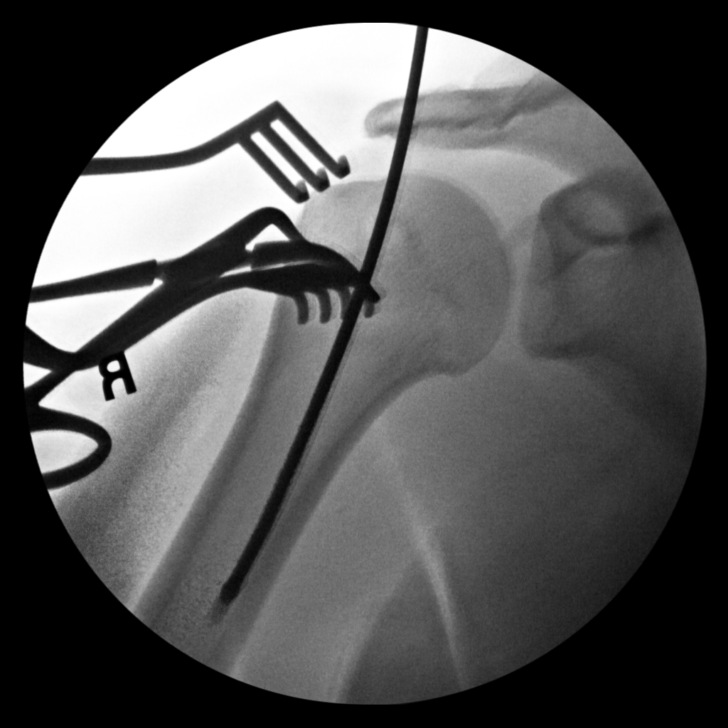
[im 5/11]
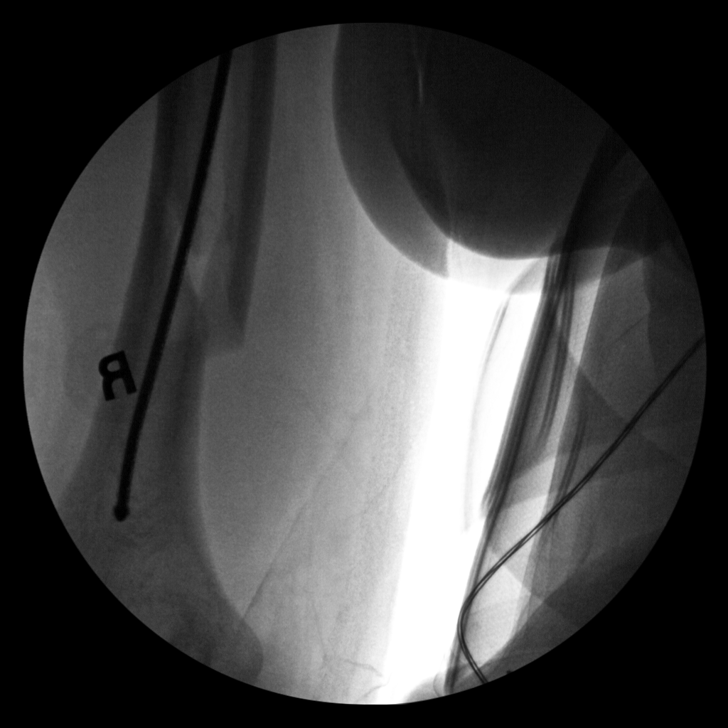
[im 6/11]
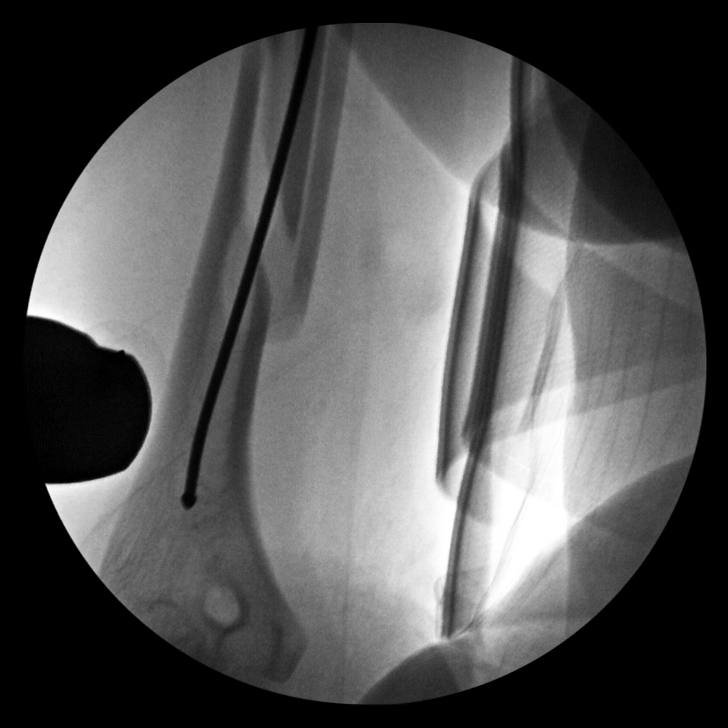
[im 7/11]
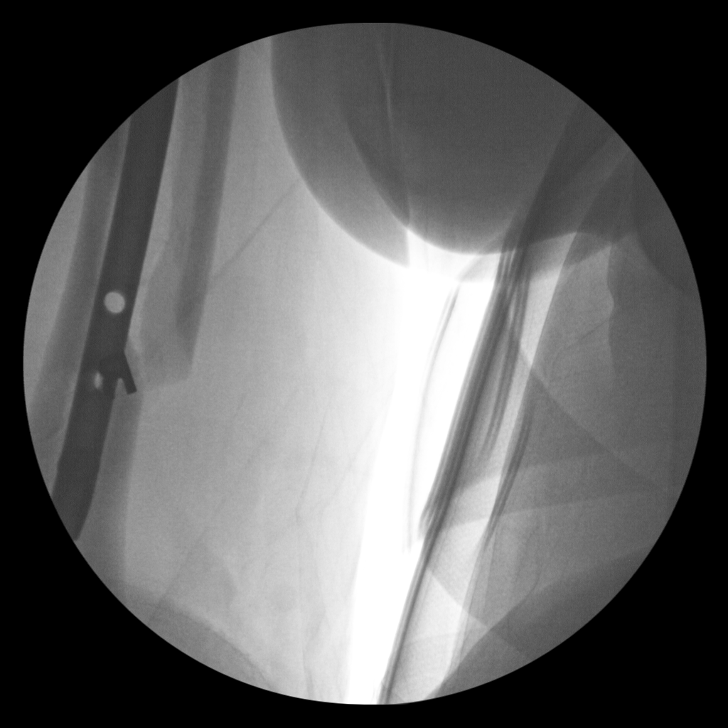
[im 8/11]
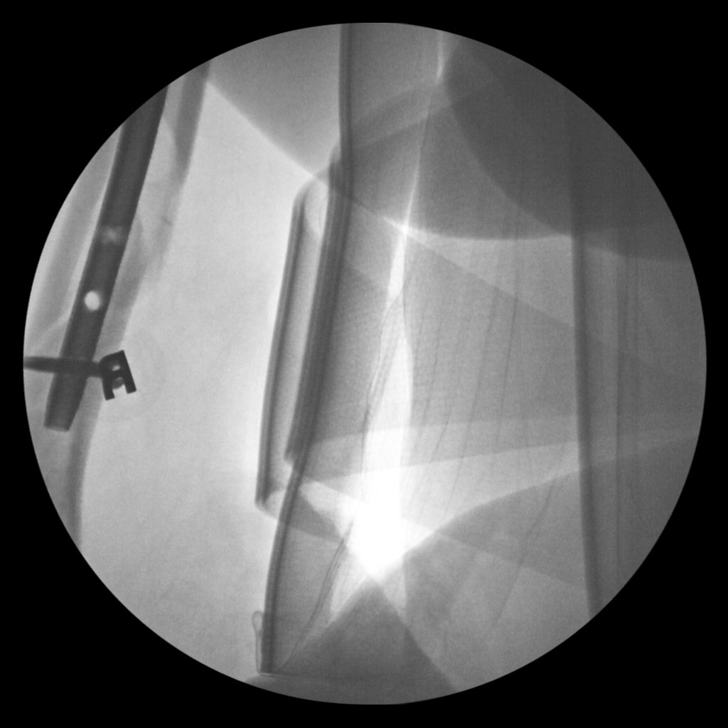
[im 9/11]
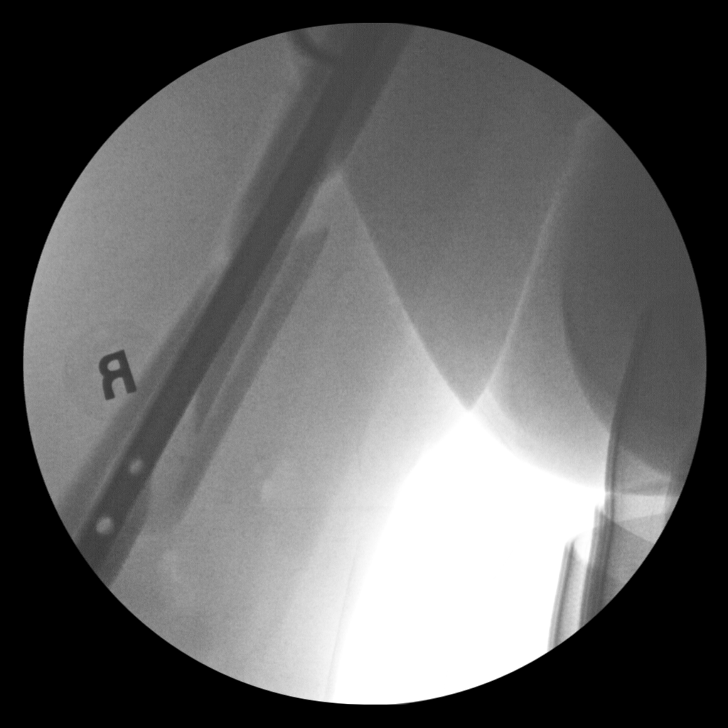
[im 10/11]
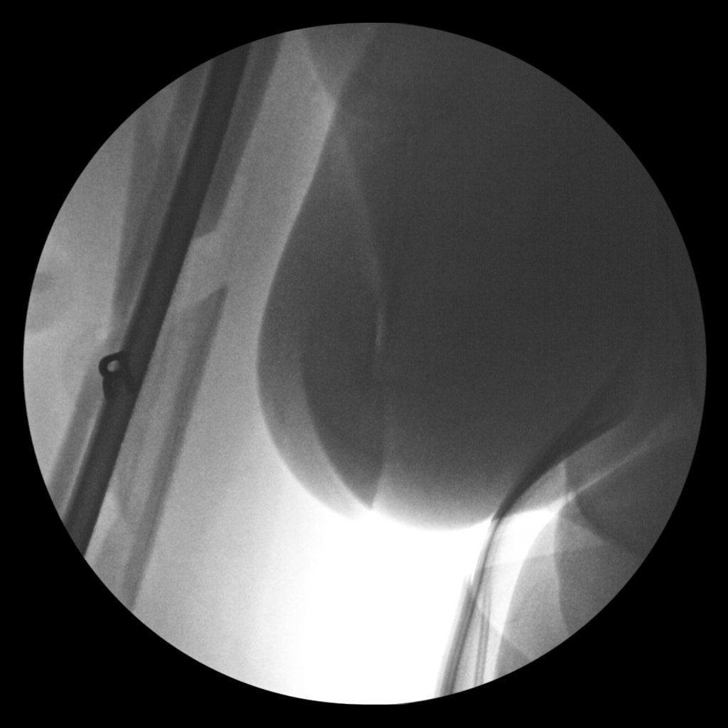
[im 11/11]
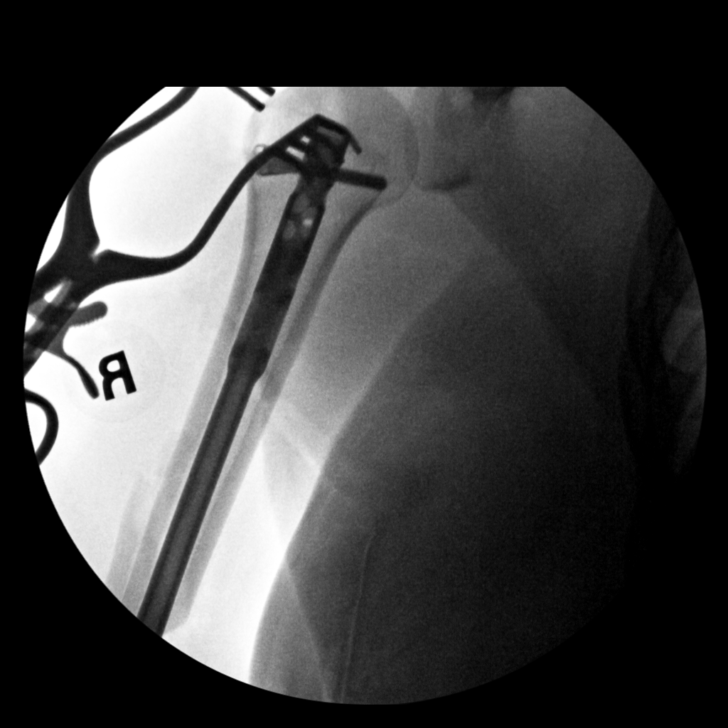

[11 of 11 positions shown; findings below may reference images not displayed]

FINDINGS: Eleven C-arm fluoroscopic images were obtained intraoperatively and
submitted for post operative interpretation. These images
demonstrate intramedullary nail and screw fixation of a comminuted
mid humeral shaft fracture with improved alignment. Please see the
performing provider's procedural report for further detail.
IMPRESSION: Intraoperative fluoroscopy, as detailed above.
